# Patient Record
Sex: Female | Born: 1980 | Race: White | Hispanic: No | Marital: Single | Smoking: Current every day smoker
Health system: Southern US, Community
[De-identification: ages and names within clinical notes are randomized; demographics above are authoritative.]

---

## 2011-03-11 ENCOUNTER — Emergency Department (HOSPITAL_COMMUNITY)
Admission: EM | Admit: 2011-03-11 | Discharge: 2011-03-11 | Disposition: A | Discharge status: Home / Self Care | Payer: Self-pay | Attending: Emergency Medicine | Admitting: Emergency Medicine

## 2011-03-11 DIAGNOSIS — R51 Headache: Secondary | ICD-10-CM | POA: Insufficient documentation

## 2011-03-11 DIAGNOSIS — K0381 Cracked tooth: Secondary | ICD-10-CM | POA: Insufficient documentation

## 2011-03-11 DIAGNOSIS — R220 Localized swelling, mass and lump, head: Secondary | ICD-10-CM | POA: Insufficient documentation

## 2011-03-11 DIAGNOSIS — M542 Cervicalgia: Secondary | ICD-10-CM | POA: Insufficient documentation

## 2011-03-11 DIAGNOSIS — K089 Disorder of teeth and supporting structures, unspecified: Secondary | ICD-10-CM | POA: Insufficient documentation

## 2011-03-11 MED ORDER — CLINDAMYCIN HCL 150 MG PO CAPS: 300.0000 mg | ORAL_CAPSULE | Freq: Three times a day (TID) | ORAL | Status: AC

## 2011-03-11 MED ORDER — IBUPROFEN 800 MG PO TABS
800.0000 mg | ORAL_TABLET | Freq: Once | ORAL | Status: AC
  Administered 2011-03-11: 800 mg via ORAL
  Filled 2011-03-11: qty 1

## 2011-03-11 MED ORDER — OXYCODONE-ACETAMINOPHEN 5-325 MG PO TABS: 2.0000 | ORAL_TABLET | ORAL | Status: AC | PRN

## 2011-03-11 MED ORDER — BUPIVACAINE HCL (PF) 0.5 % IJ SOLN
10.0000 mL | Freq: Once | INTRAMUSCULAR | Status: AC
  Administered 2011-03-11: 3 mL
  Filled 2011-03-11: qty 10

## 2011-03-11 NOTE — ED Provider Notes (Signed)
History    30yF with dental pain. Gradual onset. Worsening since last night. Persistent. L sided facial pain. Cracked previously but has not followed up with dentist. No fever or chills. No trauma. No n/v. NO difficulty breathing or swallowing. No sore throat.   CSN: [id number] Arrival date & time: 03/11/2011 10:38 AM   First MD Initiated Contact with Patient 03/11/11 1044      Chief Complaint  Patient presents with  . Dental Pain    (Consider location/radiation/quality/duration/timing/severity/associated sxs/prior treatment) HPI  History reviewed. No pertinent past medical history.  History reviewed. No pertinent past surgical history.  Family History  Problem Relation Age of Onset  . Cancer Mother     History  Substance Use Topics  . Smoking status: Current Everyday Smoker -- 0.5 packs/day  . Smokeless tobacco: Never Used  . Alcohol Use: No    OB History    Grav Para Term Preterm Abortions TAB SAB Ect Mult Living                  Review of Systems   Review of symptoms negative unless otherwise noted in HPI.   Allergies  Penicillins  Home Medications   Current Outpatient Rx  Name Route Sig Dispense Refill  . IBUPROFEN 200 MG PO TABS Oral Take 400 mg by mouth every 3 (three) hours as needed. For tooth pain     . LEVONORGESTREL 20 MCG/24HR IU IUD Intrauterine 1 each by Intrauterine route once.      Marland Kitchen CLINDAMYCIN HCL 150 MG PO CAPS Oral Take 2 capsules (300 mg total) by mouth 3 (three) times daily. 42 capsule 0  . OXYCODONE-ACETAMINOPHEN 5-325 MG PO TABS Oral Take 2 tablets by mouth every 4 (four) hours as needed for pain. 10 tablet 0    BP 111/76  Pulse 110  Temp(Src) 98.1 F (36.7 C) (Oral)  Resp 16  Ht 5\' 4"  (1.626 m)  Wt 170 lb (77.111 kg)  BMI 29.18 kg/m2  SpO2 100%  Physical Exam  Nursing note and vitals reviewed. Constitutional: She appears well-developed and well-nourished. No distress.  HENT:  Head: Normocephalic and atraumatic.  Right  Ear: External ear normal.  Left Ear: External ear normal.  Mouth/Throat: Oropharynx is clear and moist.       Tooth #17 cracked at gum line. Mild surrounding gingival swelling. No discrete abscess. Posterior pharynx clear. Uvula midline. No tongue elevation. Submental tissues soft. Mild L lateral neck tenderness. No ertythema. No nodes. Neck supple. TMs clear b/l.  Eyes: Conjunctivae are normal. Right eye exhibits no discharge. Left eye exhibits no discharge.  Neck: Neck supple.  Cardiovascular: Normal rate, regular rhythm and normal heart sounds.  Exam reveals no gallop and no friction rub.   No murmur heard. Pulmonary/Chest: Effort normal and breath sounds normal. No respiratory distress.  Abdominal: Soft. She exhibits no distension. There is no tenderness.  Musculoskeletal: She exhibits no edema and no tenderness.  Neurological: She is alert.  Skin: Skin is warm and dry.  Psychiatric: She has a normal mood and affect. Her behavior is normal. Thought content normal.    ED Course  Dental Date/Time: 03/11/2011 11:18 AM Performed by: Raeford Razor Authorized by: Raeford Razor Consent: Verbal consent obtained. Written consent not obtained. Risks and benefits: risks, benefits and alternatives were discussed Consent given by: patient Patient identity confirmed: verbally with patient Local anesthesia used: yes Anesthesia: nerve block Local anesthetic: bupivacaine 0.5% without epinephrine Anesthetic total: 1.5 ml Patient sedated: no Patient tolerance:  Patient tolerated the procedure well with no immediate complications. Comments: L inferior alveolar block.   (including critical care time)  Labs Reviewed - No data to display No results found.   1. Cracked tooth       MDM  30yF with cracked molar. Mild gingival swelling but no discrete abscess. No evidence of deep space infection on exam. Pen allergic so clinda. Pain meds. Dental block in ED. Resources given for dental  clinics.        Raeford Razor, MD 03/11/11 [phone]

## 2011-03-11 NOTE — ED Notes (Signed)
States no insurance and looking for dentist.

## 2011-03-11 NOTE — ED Notes (Signed)
Lt. Lower molar, pain and swelling

## 2011-07-23 ENCOUNTER — Emergency Department (HOSPITAL_COMMUNITY)
Admission: EM | Admit: 2011-07-23 | Discharge: 2011-07-23 | Disposition: A | Discharge status: Home / Self Care | Payer: PRIVATE HEALTH INSURANCE | Attending: Emergency Medicine | Admitting: Emergency Medicine

## 2011-07-23 ENCOUNTER — Encounter (HOSPITAL_COMMUNITY): Payer: Self-pay | Admitting: *Deleted

## 2011-07-23 DIAGNOSIS — K089 Disorder of teeth and supporting structures, unspecified: Secondary | ICD-10-CM | POA: Insufficient documentation

## 2011-07-23 DIAGNOSIS — K047 Periapical abscess without sinus: Secondary | ICD-10-CM | POA: Insufficient documentation

## 2011-07-23 DIAGNOSIS — K029 Dental caries, unspecified: Secondary | ICD-10-CM | POA: Insufficient documentation

## 2011-07-23 MED ORDER — HYDROCODONE-ACETAMINOPHEN 5-325 MG PO TABS: 1.0000 | ORAL_TABLET | ORAL | Status: AC | PRN

## 2011-07-23 MED ORDER — CLINDAMYCIN HCL 150 MG PO CAPS: 450.0000 mg | ORAL_CAPSULE | Freq: Three times a day (TID) | ORAL | Status: AC

## 2011-07-23 MED ORDER — HYDROCODONE-ACETAMINOPHEN 5-325 MG PO TABS: 1.0000 | ORAL_TABLET | Freq: Once | ORAL | Status: DC

## 2011-07-23 NOTE — Discharge Instructions (Signed)
Dental Abscess A dental abscess usually starts from an infected tooth. Antibiotic medicine and pain pills can be helpful, but dental infections require the attention of a dentist. Rinse around the infected area often with salt water (a pinch of salt in 8 oz of warm water). Do not apply heat to the outside of your face. See your dentist or oral surgeon as soon as possible.  SEEK IMMEDIATE MEDICAL CARE IF:  You have increasing, severe pain that is not relieved by medicine.   You or your child has an oral temperature above 102 F (38.9 C), not controlled by medicine.   Your baby is older than 3 months with a rectal temperature of 102 F (38.9 C) or higher.   Your baby is 3 months old or younger with a rectal temperature of 100.4 F (38 C) or higher.   You develop chills, severe headache, difficulty breathing, or trouble swallowing.   You have swelling in the neck or around the eye.  Document Released: 04/08/2005 Document Revised: 03/28/2011 Document Reviewed: 09/17/2006 Exit[employer] Patient Information 2012 [employer]. 

## 2011-07-23 NOTE — ED Notes (Signed)
Pt reports left back dental pain x 2 days, states I think it is my wisdom tooth.

## 2011-07-23 NOTE — ED Provider Notes (Signed)
History     CSN: [id number]  Arrival date & time 07/23/11  1451   First MD Initiated Contact with Patient 07/23/11 1517      Chief Complaint  Patient presents with  . Dental Pain    (Consider location/radiation/quality/duration/timing/severity/associated sxs/prior treatment) HPI Comments: Patient comes in with 2-3 days of worsening left lower wisdom tooth pain.  She knows that is broken and decayed history of increasing pain is concerned about infection.  She denies any fevers.  She can swallow okay and has no shortness of breath.  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  Patient is a 31 y.o. female presenting with tooth pain. The history is provided by the patient. No language interpreter was used.  Dental PainThe primary symptoms include mouth pain. Primary symptoms do not include headaches, fever, shortness of breath, sore throat, angioedema or cough. The symptoms began 2 days ago. The symptoms are worsening. The symptoms are new. The symptoms occur constantly.    History reviewed. No pertinent past medical history.  History reviewed. No pertinent past surgical history.  Family History  Problem Relation Age of Onset  . Cancer Mother     History  Substance Use Topics  . Smoking status: Current Everyday Smoker -- 0.5 packs/day  . Smokeless tobacco: Never Used  . Alcohol Use: No    OB History    Grav Para Term Preterm Abortions TAB SAB Ect Mult Living                  Review of Systems  Constitutional: Negative.  Negative for fever and chills.  HENT: Positive for dental problem. Negative for sore throat.   Eyes: Negative.  Negative for discharge and redness.  Respiratory: Negative.  Negative for cough and shortness of breath.   Cardiovascular: Negative.  Negative for chest pain.  Gastrointestinal: Negative.  Negative for nausea, vomiting, abdominal pain and diarrhea.    Genitourinary: Negative.  Negative for dysuria and vaginal discharge.  Musculoskeletal: Negative.  Negative for back pain.  Skin: Negative.  Negative for color change and rash.  Neurological: Negative.  Negative for syncope and headaches.  Hematological: Negative.  Negative for adenopathy.  Psychiatric/Behavioral: Negative.  Negative for confusion.  All other systems reviewed and are negative.    Allergies  Penicillins  Home Medications   Current Outpatient Rx  Name Route Sig Dispense Refill  . IBUPROFEN 200 MG PO TABS Oral Take 400-800 mg by mouth every 3 (three) hours as needed. For tooth pain    . LEVONORGESTREL 20 MCG/24HR IU IUD Intrauterine 1 each by Intrauterine route once.      Marland Kitchen CLINDAMYCIN HCL 150 MG PO CAPS Oral Take 3 capsules (450 mg total) by mouth 3 (three) times daily. 90 capsule 0  . HYDROCODONE-ACETAMINOPHEN 5-325 MG PO TABS Oral Take 1 tablet by mouth every 4 (four) hours as needed for pain. 15 tablet 0    BP 123/81  Pulse 103  Temp(Src) 97.9 F (36.6 C) (Oral)  Resp 19  SpO2 100%  Physical Exam  Nursing note and vitals reviewed. Constitutional: She is oriented to person, place, and time. She appears well-developed and well-nourished.  Non-toxic appearance. She does not have a sickly appearance.  HENT:  Head: Normocephalic and atraumatic.  Mouth/Throat:    Eyes: Conjunctivae, EOM and lids are normal. Pupils are equal, round, and reactive to light. No scleral icterus.  Neck: Trachea normal and normal range of motion. Neck supple.  Cardiovascular: Normal rate.   Pulmonary/Chest: Effort normal.  Abdominal: Normal appearance. There is no CVA tenderness.  Musculoskeletal: Normal range of motion.  Neurological: She is alert and oriented to person, place, and time. She has normal strength.  Skin: Skin is warm, dry and intact. No rash noted.  Psychiatric: She has a normal mood and affect. Her behavior is normal. Judgment and thought content normal.    ED  Course  Procedures (including critical care time)  Labs Reviewed - No data to display No results found.   1. Dental abscess       MDM  Patient with dental abscess in her left wisdom tooth.  Patient is maintaining her airway is safe for discharge home.  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  She is allergic to penicillin so place her on clindamycin and I will give her Norco for pain control.        Nat Christen, MD 07/23/11 1530

## 2014-01-11 ENCOUNTER — Emergency Department (HOSPITAL_COMMUNITY): Payer: PRIVATE HEALTH INSURANCE

## 2014-01-11 ENCOUNTER — Encounter (HOSPITAL_COMMUNITY): Payer: Self-pay | Admitting: Emergency Medicine

## 2014-01-11 ENCOUNTER — Emergency Department (HOSPITAL_COMMUNITY)
Admission: EM | Admit: 2014-01-11 | Discharge: 2014-01-11 | Disposition: A | Discharge status: Home / Self Care | Payer: PRIVATE HEALTH INSURANCE | Attending: Emergency Medicine | Admitting: Emergency Medicine

## 2014-01-11 DIAGNOSIS — S99929A Unspecified injury of unspecified foot, initial encounter: Secondary | ICD-10-CM

## 2014-01-11 DIAGNOSIS — Y9389 Activity, other specified: Secondary | ICD-10-CM | POA: Insufficient documentation

## 2014-01-11 DIAGNOSIS — F17200 Nicotine dependence, unspecified, uncomplicated: Secondary | ICD-10-CM | POA: Insufficient documentation

## 2014-01-11 DIAGNOSIS — S90121A Contusion of right lesser toe(s) without damage to nail, initial encounter: Secondary | ICD-10-CM

## 2014-01-11 DIAGNOSIS — S8990XA Unspecified injury of unspecified lower leg, initial encounter: Secondary | ICD-10-CM | POA: Insufficient documentation

## 2014-01-11 DIAGNOSIS — Z880 Allergy status to penicillin: Secondary | ICD-10-CM | POA: Insufficient documentation

## 2014-01-11 DIAGNOSIS — Y9289 Other specified places as the place of occurrence of the external cause: Secondary | ICD-10-CM | POA: Insufficient documentation

## 2014-01-11 DIAGNOSIS — S90129A Contusion of unspecified lesser toe(s) without damage to nail, initial encounter: Secondary | ICD-10-CM | POA: Insufficient documentation

## 2014-01-11 DIAGNOSIS — S99919A Unspecified injury of unspecified ankle, initial encounter: Secondary | ICD-10-CM

## 2014-01-11 DIAGNOSIS — IMO0002 Reserved for concepts with insufficient information to code with codable children: Secondary | ICD-10-CM | POA: Insufficient documentation

## 2014-01-11 NOTE — ED Provider Notes (Signed)
CSN: [id number]     Arrival date & time 01/11/14  2440 History   First MD Initiated Contact with Patient 01/11/14 0827     Chief Complaint  Patient presents with  . Toe Pain     (Consider location/radiation/quality/duration/timing/severity/associated sxs/prior Treatment) HPI Comments: States that she was chasing after her dog and hit her right great toe on the coffee table. The toe initially went in opposite directions  Patient is a 33 y.o. female presenting with toe pain. The history is provided by the patient. No language interpreter was used.  Toe Pain This is a new problem. The current episode started today. The problem occurs constantly. The problem has been unchanged. The symptoms are aggravated by walking. She has tried nothing for the symptoms.    History reviewed. No pertinent past medical history. Past Surgical History  Procedure Laterality Date  . Cesarean section     Family History  Problem Relation Age of Onset  . Cancer Mother    History  Substance Use Topics  . Smoking status: Current Every Day Smoker -- 0.50 packs/day  . Smokeless tobacco: Never Used  . Alcohol Use: No   OB History   Grav Para Term Preterm Abortions TAB SAB Ect Mult Living                 Review of Systems  Constitutional: Negative.   Respiratory: Negative.   Cardiovascular: Negative.       Allergies  Penicillins  Home Medications   Prior to Admission medications   Medication Sig Start Date End Date Taking? Authorizing Provider  ibuprofen (ADVIL,MOTRIN) 200 MG tablet Take 400-800 mg by mouth every 3 (three) hours as needed. For tooth pain    Historical Provider, MD  levonorgestrel (MIRENA) 20 MCG/24HR IUD 1 each by Intrauterine route once.      Historical Provider, MD   BP 112/72  Pulse 99  Temp(Src) 98.5 F (36.9 C) (Oral)  Resp 18  Ht  (1.651 m)  Wt 160 lb (72.576 kg)  BMI 26.63 kg/m2  SpO2 100%  LMP 12/21/2013 Physical Exam  Nursing note and vitals  reviewed. Constitutional: She is oriented to person, place, and time. She appears well-developed and well-nourished.  Cardiovascular: Normal rate and regular rhythm.   Pulmonary/Chest: Effort normal and breath sounds normal.  Musculoskeletal:  Tender along the first pip joint of the right great toe. No gross deformity or swelling. Pulses intact  Neurological: She is alert and oriented to person, place, and time.  Skin: Skin is warm and dry.    ED Course  Procedures (including critical care time) Labs Review Labs Reviewed - No data to display  Imaging Review Dg Foot Complete Right  01/11/2014   CLINICAL DATA:  Pain post trauma  EXAM: RIGHT FOOT COMPLETE - 3+ VIEW  COMPARISON:  None.  FINDINGS: Frontal, oblique, and lateral views were obtained. There is no fracture or dislocation. Joint spaces appear intact. There is mild hallux valgus deformity at the first MTP joint. No erosive change.  IMPRESSION: Mild hallux valgus deformity at the first MTP joint. No fracture or dislocation. No appreciable arthropathic change.   Electronically Signed   By: Bretta Bang M.D.   On: 01/11/2014 08:58     EKG Interpretation None      MDM   Final diagnoses:  Toe contusion, right, initial encounter    No bony abnormality noted. Pt instructed on care at home.refusing any taping of toes    Teressa Lower, NP  01/11/14 0906 

## 2014-01-11 NOTE — ED Notes (Signed)
Pt reports hit r great toe on her coffee table this morning.

## 2014-01-11 NOTE — Discharge Instructions (Signed)
Contusion °A contusion is a deep bruise. Contusions are the result of an injury that caused bleeding under the skin. The contusion may turn blue, purple, or yellow. Minor injuries will give you a painless contusion, but more severe contusions may stay painful and swollen for a few weeks.  °CAUSES  °A contusion is usually caused by a blow, trauma, or direct force to an area of the body. °SYMPTOMS  °· Swelling and redness of the injured area. °· Bruising of the injured area. °· Tenderness and soreness of the injured area. °· Pain. °DIAGNOSIS  °The diagnosis can be made by taking a history and physical exam. An X-ray, CT scan, or MRI may be needed to determine if there were any associated injuries, such as fractures. °TREATMENT  °Specific treatment will depend on what area of the body was injured. In general, the best treatment for a contusion is resting, icing, elevating, and applying cold compresses to the injured area. Over-the-counter medicines may also be recommended for pain control. Ask your caregiver what the best treatment is for your contusion. °HOME CARE INSTRUCTIONS  °· Put ice on the injured area. °¨ Put ice in a plastic bag. °¨ Place a towel between your skin and the bag. °¨ Leave the ice on for 15-20 minutes, 3-4 times a day, or as directed by your health care provider. °· Only take over-the-counter or prescription medicines for pain, discomfort, or fever as directed by your caregiver. Your caregiver may recommend avoiding anti-inflammatory medicines (aspirin, ibuprofen, and naproxen) for 48 hours because these medicines may increase bruising. °· Rest the injured area. °· If possible, elevate the injured area to reduce swelling. °SEEK IMMEDIATE MEDICAL CARE IF:  °· You have increased bruising or swelling. °· You have pain that is getting worse. °· Your swelling or pain is not relieved with medicines. °MAKE SURE YOU:  °· Understand these instructions. °· Will watch your condition. °· Will get help right  away if you are not doing well or get worse. °Document Released: 01/16/2005 Document Revised: 04/13/2013 Document Reviewed: 02/11/2011 °Exit[employer]® Patient Information ©2015 [employer]. This information is not intended to replace advice given to you by your health care provider. Make sure you discuss any questions you have with your health care provider. ° °

## 2014-01-11 NOTE — ED Provider Notes (Signed)
Medical screening examination/treatment/procedure(s) were performed by non-physician practitioner and as supervising physician I was immediately available for consultation/collaboration.   EKG Interpretation None        Meridian Scherger L Arisbeth Purrington, MD 01/11/14 1250 

## 2014-11-23 ENCOUNTER — Emergency Department (HOSPITAL_COMMUNITY)
Admission: EM | Admit: 2014-11-23 | Discharge: 2014-11-23 | Disposition: A | Discharge status: Home / Self Care | Payer: Self-pay | Attending: Emergency Medicine | Admitting: Emergency Medicine

## 2014-11-23 ENCOUNTER — Encounter (HOSPITAL_COMMUNITY): Payer: Self-pay

## 2014-11-23 ENCOUNTER — Emergency Department (HOSPITAL_COMMUNITY): Payer: Self-pay

## 2014-11-23 ENCOUNTER — Emergency Department (HOSPITAL_COMMUNITY): Payer: PRIVATE HEALTH INSURANCE

## 2014-11-23 DIAGNOSIS — G43109 Migraine with aura, not intractable, without status migrainosus: Secondary | ICD-10-CM | POA: Insufficient documentation

## 2014-11-23 DIAGNOSIS — Z3202 Encounter for pregnancy test, result negative: Secondary | ICD-10-CM | POA: Insufficient documentation

## 2014-11-23 DIAGNOSIS — Z720 Tobacco use: Secondary | ICD-10-CM | POA: Insufficient documentation

## 2014-11-23 DIAGNOSIS — Z880 Allergy status to penicillin: Secondary | ICD-10-CM | POA: Insufficient documentation

## 2014-11-23 DIAGNOSIS — I639 Cerebral infarction, unspecified: Secondary | ICD-10-CM | POA: Insufficient documentation

## 2014-11-23 LAB — RAPID URINE DRUG SCREEN, HOSP PERFORMED
AMPHETAMINES: NOT DETECTED
Barbiturates: NOT DETECTED
Benzodiazepines: NOT DETECTED
Cocaine: NOT DETECTED
OPIATES: NOT DETECTED
TETRAHYDROCANNABINOL: POSITIVE — AB

## 2014-11-23 LAB — PROTIME-INR
INR: 1.15 (ref 0.00–1.49)
PROTHROMBIN TIME: 14.9 s (ref 11.6–15.2)

## 2014-11-23 LAB — URINALYSIS, ROUTINE W REFLEX MICROSCOPIC
BILIRUBIN URINE: NEGATIVE
Glucose, UA: NEGATIVE mg/dL
Hgb urine dipstick: NEGATIVE
Ketones, ur: NEGATIVE mg/dL
Leukocytes, UA: NEGATIVE
Nitrite: NEGATIVE
PH: 7 (ref 5.0–8.0)
PROTEIN: NEGATIVE mg/dL
Specific Gravity, Urine: 1.02 (ref 1.005–1.030)
UROBILINOGEN UA: 0.2 mg/dL (ref 0.0–1.0)

## 2014-11-23 LAB — CBC
HCT: 41.8 % (ref 36.0–46.0)
HEMOGLOBIN: 14 g/dL (ref 12.0–15.0)
MCH: 32.8 pg (ref 26.0–34.0)
MCHC: 33.5 g/dL (ref 30.0–36.0)
MCV: 97.9 fL (ref 78.0–100.0)
PLATELETS: 143 10*3/uL — AB (ref 150–400)
RBC: 4.27 MIL/uL (ref 3.87–5.11)
RDW: 12.7 % (ref 11.5–15.5)
WBC: 7 10*3/uL (ref 4.0–10.5)

## 2014-11-23 LAB — COMPREHENSIVE METABOLIC PANEL
ALBUMIN: 4.8 g/dL (ref 3.5–5.0)
ALT: 18 U/L (ref 14–54)
AST: 18 U/L (ref 15–41)
Alkaline Phosphatase: 51 U/L (ref 38–126)
Anion gap: 9 (ref 5–15)
BILIRUBIN TOTAL: 0.7 mg/dL (ref 0.3–1.2)
BUN: 14 mg/dL (ref 6–20)
CHLORIDE: 106 mmol/L (ref 101–111)
CO2: 25 mmol/L (ref 22–32)
CREATININE: 0.74 mg/dL (ref 0.44–1.00)
Calcium: 9.3 mg/dL (ref 8.9–10.3)
GFR calc Af Amer: >60 mL/min (ref >60)
GFR calc non Af Amer: >60 mL/min (ref >60)
Glucose, Bld: 115 mg/dL — ABNORMAL HIGH (ref 65–99)
POTASSIUM: 3.9 mmol/L (ref 3.5–5.1)
SODIUM: 140 mmol/L (ref 135–145)
TOTAL PROTEIN: 7.8 g/dL (ref 6.5–8.1)

## 2014-11-23 LAB — DIFFERENTIAL
BASOS ABS: 0 10*3/uL (ref 0.0–0.1)
Basophils Relative: 1 % (ref 0–1)
EOS ABS: 0.2 10*3/uL (ref 0.0–0.7)
EOS PCT: 2 % (ref 0–5)
LYMPHS ABS: 1.9 10*3/uL (ref 0.7–4.0)
LYMPHS PCT: 27 % (ref 12–46)
Monocytes Absolute: 0.4 10*3/uL (ref 0.1–1.0)
Monocytes Relative: 6 % (ref 3–12)
Neutro Abs: 4.5 10*3/uL (ref 1.7–7.7)
Neutrophils Relative %: 64 % (ref 43–77)

## 2014-11-23 LAB — ETHANOL: Alcohol, Ethyl (B): <5 mg/dL (ref <5)

## 2014-11-23 LAB — POC URINE PREG, ED: Preg Test, Ur: NEGATIVE

## 2014-11-23 LAB — APTT: APTT: 29 s (ref 24–37)

## 2014-11-23 LAB — TROPONIN I: Troponin I: <0.03 ng/mL (ref <0.031)

## 2014-11-23 MED ORDER — SODIUM CHLORIDE 0.9 % IV SOLN
INTRAVENOUS | Status: DC
  Administered 2014-11-23: 11:00:00 via INTRAVENOUS

## 2014-11-23 MED ORDER — HYDROMORPHONE HCL 2 MG/ML IJ SOLN
2.0000 mg | Freq: Once | INTRAMUSCULAR | Status: AC
  Administered 2014-11-23: 2 mg via INTRAMUSCULAR
  Filled 2014-11-23: qty 1

## 2014-11-23 MED ORDER — GADOBENATE DIMEGLUMINE 529 MG/ML IV SOLN
15.0000 mL | Freq: Once | INTRAVENOUS | Status: AC | PRN
  Administered 2014-11-23: 15 mL via INTRAVENOUS

## 2014-11-23 MED ORDER — HYDROMORPHONE HCL 1 MG/ML IJ SOLN
1.0000 mg | Freq: Once | INTRAMUSCULAR | Status: AC
  Administered 2014-11-23: 1 mg via INTRAVENOUS
  Filled 2014-11-23: qty 1

## 2014-11-23 MED ORDER — ONDANSETRON HCL 4 MG/2ML IJ SOLN
4.0000 mg | Freq: Once | INTRAMUSCULAR | Status: AC
  Administered 2014-11-23: 4 mg via INTRAVENOUS
  Filled 2014-11-23: qty 2

## 2014-11-23 MED ORDER — PROMETHAZINE HCL 25 MG/ML IJ SOLN
12.5000 mg | Freq: Once | INTRAMUSCULAR | Status: AC
  Administered 2014-11-23: 12.5 mg via INTRAVENOUS
  Filled 2014-11-23: qty 1

## 2014-11-23 MED ORDER — DEXAMETHASONE SODIUM PHOSPHATE 4 MG/ML IJ SOLN
10.0000 mg | Freq: Once | INTRAMUSCULAR | Status: AC
  Administered 2014-11-23: 10 mg via INTRAVENOUS
  Filled 2014-11-23: qty 3

## 2014-11-23 MED ORDER — DIPHENHYDRAMINE HCL 50 MG/ML IJ SOLN
12.5000 mg | Freq: Once | INTRAMUSCULAR | Status: AC
  Administered 2014-11-23: 12.5 mg via INTRAVENOUS
  Filled 2014-11-23: qty 1

## 2014-11-23 MED ORDER — SODIUM CHLORIDE 0.9 % IV BOLUS (SEPSIS)
1000.0000 mL | Freq: Once | INTRAVENOUS | Status: AC
  Administered 2014-11-23: 1000 mL via INTRAVENOUS

## 2014-11-23 NOTE — ED Notes (Signed)
Dr. Deretha Emory notified of pt's vision impairment of only being able to see "white" from right eye.

## 2014-11-23 NOTE — ED Notes (Signed)
Pt remains in MRI 

## 2014-11-23 NOTE — Discharge Instructions (Signed)
MRIs without any significant findings. This is most likely a complicated migraine. Go home and rest typically for the next 24 hours. Some of the migraine medicine given here continue to work for about 24 hours. Return for any new or worse symptoms. Work note provided.

## 2014-11-23 NOTE — ED Notes (Signed)
Pt reports at 0845 she was at work and started feeling dizzy, shortly after that started having pain in back of head and seeing white spots in r eye.  Denies history of migraines.  Reports nausea, no vomiting.

## 2014-11-23 NOTE — ED Notes (Signed)
Pt made aware to return if symptoms worsen or if any life threatening symptoms occur.   

## 2014-11-23 NOTE — ED Notes (Signed)
neurotele at bedside and in progress.

## 2014-11-23 NOTE — Progress Notes (Signed)
CODE STROKE  Beeper 10:55 Devona Konig at [facility] [phone]

## 2014-11-23 NOTE — ED Notes (Signed)
Dr. Deretha Emory cancelled code stroke 1138

## 2014-11-23 NOTE — ED Notes (Signed)
Called SOC at 11:02

## 2014-11-23 NOTE — ED Provider Notes (Signed)
CSN: [id number]     Arrival date & time 11/23/14  1015 History  This chart was scribed for Rhonda Mulders, MD by Elon Spanner, ED Scribe. This patient was seen in room APA02/APA02 and the patient's care was started at 10:44 AM.   Chief Complaint  Patient presents with  . Code Stroke   The history is provided by the patient. No language interpreter was used.   HPI Comments: Rhonda Harvey is a 34 y.o. female with no personal or family history of migraine who presents to the Emergency Department complaining of a 10/10 right, back of the Harvey headache onset 8:45 am today.  The patient reports associated dizziness, nausea and blurred vision onset concurrently with the headache.  Currently, the patient only sees a white out of her right eye.  She denies fever, vomiting.  LNMP: 5 years ago (IUD in place).   History reviewed. No pertinent past medical history.   Past Surgical History  Procedure Laterality Date  . Cesarean section     Family History  Problem Relation Age of Onset  . Cancer Mother    History  Substance Use Topics  . Smoking status: Current Every Day Smoker -- 0.50 packs/day  . Smokeless tobacco: Never Used  . Alcohol Use: No   OB History    No data available     Review of Systems  Constitutional: Negative for fever and chills.  HENT: Negative for rhinorrhea and sore throat.   Eyes: Positive for visual disturbance.  Respiratory: Negative for cough and shortness of breath.   Cardiovascular: Negative for chest pain and leg swelling.  Gastrointestinal: Positive for nausea. Negative for vomiting, abdominal pain and diarrhea.  Genitourinary: Negative for dysuria and hematuria.  Musculoskeletal: Positive for neck pain. Negative for back pain.  Skin: Negative for rash.  Neurological: Positive for dizziness and headaches.  Hematological: Does not bruise/bleed easily.  Psychiatric/Behavioral: Negative for confusion.      Allergies  Penicillins  Home Medications    Prior to Admission medications   Medication Sig Start Date End Date Taking? Authorizing Provider  ibuprofen (ADVIL,MOTRIN) 200 MG tablet Take 400 mg by mouth every 6 (six) hours as needed for moderate pain.   Yes Historical Provider, MD  levonorgestrel (MIRENA) 20 MCG/24HR IUD 1 each by Intrauterine route once.   Yes Historical Provider, MD   BP 101/63 mmHg  Pulse 65  Temp(Src) 97.4 F (36.3 C) (Oral)  Resp 13  Ht 5\' 4"  (1.626 m)  Wt 162 lb (73.483 kg)  BMI 27.79 kg/m2  SpO2 100%  LMP  Physical Exam  Constitutional: She is oriented to person, place, and time. She appears well-developed and well-nourished. No distress.  HENT:  Harvey: Normocephalic and atraumatic.  Pupils normal.  Eyes track normal.  Sclera clear.    Eyes: Conjunctivae and EOM are normal.  Neck: Neck supple. No tracheal deviation present.  Cardiovascular: Normal rate and regular rhythm.   Pulmonary/Chest: Effort normal. No respiratory distress.  Lungs CTA bilaterally.   Abdominal: Bowel sounds are normal. There is no tenderness.  Musculoskeletal: Normal range of motion.  Neurological: She is alert and oriented to person, place, and time. No cranial nerve deficit. She exhibits normal muscle tone. Coordination normal.  Skin: Skin is warm and dry.  Psychiatric: She has a normal mood and affect. Her behavior is normal.  Nursing note and vitals reviewed.   ED Course  Procedures (including critical care time)  DIAGNOSTIC STUDIES: Oxygen Saturation is 100% on RA, normal  by my interpretation.    COORDINATION OF CARE:  10:46 AM Discussed treatment plan with patient at bedside.  Patient acknowledges and agrees with plan.    Labs Review Labs Reviewed  CBC - Abnormal; Notable for the following:    Platelets 143 (*)    All other components within normal limits  COMPREHENSIVE METABOLIC PANEL - Abnormal; Notable for the following:    Glucose, Bld 115 (*)    All other components within normal limits  URINE RAPID  DRUG SCREEN, HOSP PERFORMED - Abnormal; Notable for the following:    Tetrahydrocannabinol POSITIVE (*)    All other components within normal limits  URINALYSIS, ROUTINE W REFLEX MICROSCOPIC (NOT AT [facility]) - Abnormal; Notable for the following:    APPearance HAZY (*)    All other components within normal limits  ETHANOL  PROTIME-INR  APTT  DIFFERENTIAL  TROPONIN I  POC URINE PREG, ED   Results for orders placed or performed during the hospital encounter of 11/23/14  Ethanol  Result Value Ref Range   Alcohol, Ethyl (B) <5 <5 mg/dL  Protime-INR  Result Value Ref Range   Prothrombin Time 14.9 11.6 - 15.2 seconds   INR 1.15 0.00 - 1.49  APTT  Result Value Ref Range   aPTT 29 24 - 37 seconds  CBC  Result Value Ref Range   WBC 7.0 4.0 - 10.5 K/uL   RBC 4.27 3.87 - 5.11 MIL/uL   Hemoglobin 14.0 12.0 - 15.0 g/dL   HCT 16.1 09.6 - 04.5 %   MCV 97.9 78.0 - 100.0 fL   MCH 32.8 26.0 - 34.0 pg   MCHC 33.5 30.0 - 36.0 g/dL   RDW 40.9 81.1 - 91.4 %   Platelets 143 (L) 150 - 400 K/uL  Differential  Result Value Ref Range   Neutrophils Relative % 64 43 - 77 %   Neutro Abs 4.5 1.7 - 7.7 K/uL   Lymphocytes Relative 27 12 - 46 %   Lymphs Abs 1.9 0.7 - 4.0 K/uL   Monocytes Relative 6 3 - 12 %   Monocytes Absolute 0.4 0.1 - 1.0 K/uL   Eosinophils Relative 2 0 - 5 %   Eosinophils Absolute 0.2 0.0 - 0.7 K/uL   Basophils Relative 1 0 - 1 %   Basophils Absolute 0.0 0.0 - 0.1 K/uL  Comprehensive metabolic panel  Result Value Ref Range   Sodium 140 135 - 145 mmol/L   Potassium 3.9 3.5 - 5.1 mmol/L   Chloride 106 101 - 111 mmol/L   CO2 25 22 - 32 mmol/L   Glucose, Bld 115 (H) 65 - 99 mg/dL   BUN 14 6 - 20 mg/dL   Creatinine, Ser 7.82 0.44 - 1.00 mg/dL   Calcium 9.3 8.9 - 95.6 mg/dL   Total Protein 7.8 6.5 - 8.1 g/dL   Albumin 4.8 3.5 - 5.0 g/dL   AST 18 15 - 41 U/L   ALT 18 14 - 54 U/L   Alkaline Phosphatase 51 38 - 126 U/L   Total Bilirubin 0.7 0.3 - 1.2 mg/dL   GFR calc non Af Amer  >60 >60 mL/min   GFR calc Af Amer >60 >60 mL/min   Anion gap 9 5 - 15  Urine rapid drug screen (hosp performed)not at [facility]  Result Value Ref Range   Opiates NONE DETECTED NONE DETECTED   Cocaine NONE DETECTED NONE DETECTED   Benzodiazepines NONE DETECTED NONE DETECTED   Amphetamines NONE DETECTED NONE DETECTED  Tetrahydrocannabinol POSITIVE (A) NONE DETECTED   Barbiturates NONE DETECTED NONE DETECTED  Urinalysis, Routine w reflex microscopic (not at [facility])  Result Value Ref Range   Color, Urine YELLOW YELLOW   APPearance HAZY (A) CLEAR   Specific Gravity, Urine 1.020 1.005 - 1.030   pH 7.0 5.0 - 8.0   Glucose, UA NEGATIVE NEGATIVE mg/dL   Hgb urine dipstick NEGATIVE NEGATIVE   Bilirubin Urine NEGATIVE NEGATIVE   Ketones, ur NEGATIVE NEGATIVE mg/dL   Protein, ur NEGATIVE NEGATIVE mg/dL   Urobilinogen, UA 0.2 0.0 - 1.0 mg/dL   Nitrite NEGATIVE NEGATIVE   Leukocytes, UA NEGATIVE NEGATIVE  Troponin I  Result Value Ref Range   Troponin I <0.03 <0.031 ng/mL  POC Urine Pregnancy, ED  (not at [facility])  Result Value Ref Range   Preg Test, Ur NEGATIVE NEGATIVE     Imaging Review Ct Harvey Wo Contrast  11/23/2014   CLINICAL DATA:  Acute onset posterior headache with blurred vision right eye  EXAM: CT Harvey WITHOUT CONTRAST  TECHNIQUE: Contiguous axial images were obtained from the base of the skull through the vertex without intravenous contrast.  COMPARISON:  None.  FINDINGS: The ventricles are normal in size and configuration. There is no intracranial mass, hemorrhage, extra-axial fluid collection, or midline shift. The gray-white compartments are normal. No acute infarct evident. The middle cerebral arteries do not show increased attenuation on either side. Bony calvarium appears intact. The mastoid air cells are clear. There is patchy ethmoid sinus disease bilaterally. Visualized orbits appear symmetric and normal bilaterally.  IMPRESSION: Patchy ethmoid sinus disease bilaterally. Study  otherwise unremarkable. In particular, no evidence of mass, hemorrhage, or acute appearing infarct.  Critical Value/emergent results were called by telephone at the time of interpretation on 11/23/2014 at 11:08 am to Dr. Vanetta Harvey , who verbally acknowledged these results.   Electronically Signed   By: Bretta Bang III M.D.   On: 11/23/2014 11:10   Rhonda Harvey Wo Contrast  11/23/2014   CLINICAL DATA:  Headache and loss of vision in the RIGHT eye for 1 day. No known injury.  EXAM: Rhonda Harvey WITHOUT CONTRAST  Rhonda CIRCLE OF WILLIS WITHOUT CONTRAST  MRV INTRACRANIAL WITHOUT  CONTRAST  MRI OF THE ORBITS WITHOUT AND WITH CONTRAST  TECHNIQUE: Multiplanar, multiecho pulse sequences of the brain and surrounding structures were obtained according to standard protocol without and with intravenous contrast; Multiplanar and multiecho pulse sequences of the orbits and surrounding structures were obtained including fat saturation techniques, before and after intravenous contrast administration.; Angiographic images of the Circle of Willis were obtained using MRA technique without intravenous contrast.; Angiographic images of the intracranial venous structures were obtained using MRV technique without intravenous contrast. MRI of the orbits were obtained without and with contrast using fat saturation techniques.  CONTRAST:  15mL MULTIHANCE GADOBENATE DIMEGLUMINE 529 MG/ML IV SOLN  COMPARISON:  None.  CT Harvey earlier today.  FINDINGS: Rhonda Harvey FINDINGS  No evidence for acute infarction, hemorrhage, mass lesion, hydrocephalus, or extra-axial fluid. There is no atrophy or white matter disease. Flow voids are maintained throughout the carotid, basilar, and vertebral arteries. There are no areas of chronic hemorrhage. Pituitary, pineal, and cerebellar tonsils unremarkable. No upper cervical lesions. Post infusion, no abnormal enhancement of the brain or meninges. Visualized calvarium, skull base, and upper cervical osseous  structures unremarkable. Scalp and extracranial soft tissues, orbits, sinuses, and mastoids show no acute process.  Rhonda CIRCLE OF WILLIS FINDINGS  The internal carotid arteries are  widely patent. The basilar artery is widely patent with vertebrals codominant. There is no intracranial stenosis or aneurysm. Both ophthalmic arteries appear patent.  MRV INTRACRANIAL FINDINGS  The superior sagittal sinus is widely patent. Both transverse sinuses and sigmoid sinuses are widely patent. Both internal jugular veins are unremarkable.  Internal cerebral veins are widely patent. Vein of Galen and straight sinus are widely patent. No cavernous sinus abnormality.  MRI ORBITS FINDINGS  The globes are equal in size and appear normal. Gaze is mildly dysconjugate. Normal-appearing optic nerves. No perioptic nerve sheath dilatation. No evidence for papilledema. No medial or inferior blowout injury. No evidence for pseudotumor or lacrimal gland enlargement. Retro-orbital/intraconal fat is normal. Mild chronic sinus disease without orbital inflammation. Normal sized orbital musculature. No intraorbital masses or evidence for vascular anomaly.  IMPRESSION: Negative MRI of the brain. No acute or focal intracranial abnormality.  Negative MRI of the orbits. No cause seen for RIGHT eye visual loss.  Normal MRA intracranial. No evidence for aneurysm or ophthalmic artery occlusion.  Normal MRV intracranial.  No venous sinus thrombosis is evident.   Electronically Signed   By: Elsie Stain M.D.   On: 11/23/2014 14:29   Rhonda Harvey Contrast  11/23/2014   CLINICAL DATA:  Headache and loss of vision in the RIGHT eye for 1 day. No known injury.  EXAM: Rhonda Harvey WITHOUT CONTRAST  Rhonda CIRCLE OF WILLIS WITHOUT CONTRAST  MRV INTRACRANIAL WITHOUT  CONTRAST  MRI OF THE ORBITS WITHOUT AND WITH CONTRAST  TECHNIQUE: Multiplanar, multiecho pulse sequences of the brain and surrounding structures were obtained according to standard protocol without and with  intravenous contrast; Multiplanar and multiecho pulse sequences of the orbits and surrounding structures were obtained including fat saturation techniques, before and after intravenous contrast administration.; Angiographic images of the Circle of Willis were obtained using MRA technique without intravenous contrast.; Angiographic images of the intracranial venous structures were obtained using MRV technique without intravenous contrast. MRI of the orbits were obtained without and with contrast using fat saturation techniques.  CONTRAST:  15mL MULTIHANCE GADOBENATE DIMEGLUMINE 529 MG/ML IV SOLN  COMPARISON:  None.  CT Harvey earlier today.  FINDINGS: Rhonda Harvey FINDINGS  No evidence for acute infarction, hemorrhage, mass lesion, hydrocephalus, or extra-axial fluid. There is no atrophy or white matter disease. Flow voids are maintained throughout the carotid, basilar, and vertebral arteries. There are no areas of chronic hemorrhage. Pituitary, pineal, and cerebellar tonsils unremarkable. No upper cervical lesions. Post infusion, no abnormal enhancement of the brain or meninges. Visualized calvarium, skull base, and upper cervical osseous structures unremarkable. Scalp and extracranial soft tissues, orbits, sinuses, and mastoids show no acute process.  Rhonda CIRCLE OF WILLIS FINDINGS  The internal carotid arteries are widely patent. The basilar artery is widely patent with vertebrals codominant. There is no intracranial stenosis or aneurysm. Both ophthalmic arteries appear patent.  MRV INTRACRANIAL FINDINGS  The superior sagittal sinus is widely patent. Both transverse sinuses and sigmoid sinuses are widely patent. Both internal jugular veins are unremarkable.  Internal cerebral veins are widely patent. Vein of Galen and straight sinus are widely patent. No cavernous sinus abnormality.  MRI ORBITS FINDINGS  The globes are equal in size and appear normal. Gaze is mildly dysconjugate. Normal-appearing optic nerves. No perioptic  nerve sheath dilatation. No evidence for papilledema. No medial or inferior blowout injury. No evidence for pseudotumor or lacrimal gland enlargement. Retro-orbital/intraconal fat is normal. Mild chronic sinus disease without orbital inflammation. Normal sized orbital musculature.  No intraorbital masses or evidence for vascular anomaly.  IMPRESSION: Negative MRI of the brain. No acute or focal intracranial abnormality.  Negative MRI of the orbits. No cause seen for RIGHT eye visual loss.  Normal MRA intracranial. No evidence for aneurysm or ophthalmic artery occlusion.  Normal MRV intracranial.  No venous sinus thrombosis is evident.   Electronically Signed   By: Elsie Stain M.D.   On: 11/23/2014 14:29   Rhonda Mrv Harvey Wo Cm  11/23/2014   CLINICAL DATA:  Headache and loss of vision in the RIGHT eye for 1 day. No known injury.  EXAM: Rhonda Harvey WITHOUT CONTRAST  Rhonda CIRCLE OF WILLIS WITHOUT CONTRAST  MRV INTRACRANIAL WITHOUT  CONTRAST  MRI OF THE ORBITS WITHOUT AND WITH CONTRAST  TECHNIQUE: Multiplanar, multiecho pulse sequences of the brain and surrounding structures were obtained according to standard protocol without and with intravenous contrast; Multiplanar and multiecho pulse sequences of the orbits and surrounding structures were obtained including fat saturation techniques, before and after intravenous contrast administration.; Angiographic images of the Circle of Willis were obtained using MRA technique without intravenous contrast.; Angiographic images of the intracranial venous structures were obtained using MRV technique without intravenous contrast. MRI of the orbits were obtained without and with contrast using fat saturation techniques.  CONTRAST:  15mL MULTIHANCE GADOBENATE DIMEGLUMINE 529 MG/ML IV SOLN  COMPARISON:  None.  CT Harvey earlier today.  FINDINGS: Rhonda Harvey FINDINGS  No evidence for acute infarction, hemorrhage, mass lesion, hydrocephalus, or extra-axial fluid. There is no atrophy or white matter  disease. Flow voids are maintained throughout the carotid, basilar, and vertebral arteries. There are no areas of chronic hemorrhage. Pituitary, pineal, and cerebellar tonsils unremarkable. No upper cervical lesions. Post infusion, no abnormal enhancement of the brain or meninges. Visualized calvarium, skull base, and upper cervical osseous structures unremarkable. Scalp and extracranial soft tissues, orbits, sinuses, and mastoids show no acute process.  Rhonda CIRCLE OF WILLIS FINDINGS  The internal carotid arteries are widely patent. The basilar artery is widely patent with vertebrals codominant. There is no intracranial stenosis or aneurysm. Both ophthalmic arteries appear patent.  MRV INTRACRANIAL FINDINGS  The superior sagittal sinus is widely patent. Both transverse sinuses and sigmoid sinuses are widely patent. Both internal jugular veins are unremarkable.  Internal cerebral veins are widely patent. Vein of Galen and straight sinus are widely patent. No cavernous sinus abnormality.  MRI ORBITS FINDINGS  The globes are equal in size and appear normal. Gaze is mildly dysconjugate. Normal-appearing optic nerves. No perioptic nerve sheath dilatation. No evidence for papilledema. No medial or inferior blowout injury. No evidence for pseudotumor or lacrimal gland enlargement. Retro-orbital/intraconal fat is normal. Mild chronic sinus disease without orbital inflammation. Normal sized orbital musculature. No intraorbital masses or evidence for vascular anomaly.  IMPRESSION: Negative MRI of the brain. No acute or focal intracranial abnormality.  Negative MRI of the orbits. No cause seen for RIGHT eye visual loss.  Normal MRA intracranial. No evidence for aneurysm or ophthalmic artery occlusion.  Normal MRV intracranial.  No venous sinus thrombosis is evident.   Electronically Signed   By: Elsie Stain M.D.   On: 11/23/2014 14:29   Rhonda Harvey Wo/w Cm  11/23/2014   CLINICAL DATA:  Headache and loss of vision in the RIGHT  eye for 1 day. No known injury.  EXAM: Rhonda Harvey WITHOUT CONTRAST  Rhonda CIRCLE OF WILLIS WITHOUT CONTRAST  MRV INTRACRANIAL WITHOUT  CONTRAST  MRI OF THE ORBITS WITHOUT AND WITH  CONTRAST  TECHNIQUE: Multiplanar, multiecho pulse sequences of the brain and surrounding structures were obtained according to standard protocol without and with intravenous contrast; Multiplanar and multiecho pulse sequences of the orbits and surrounding structures were obtained including fat saturation techniques, before and after intravenous contrast administration.; Angiographic images of the Circle of Willis were obtained using MRA technique without intravenous contrast.; Angiographic images of the intracranial venous structures were obtained using MRV technique without intravenous contrast. MRI of the orbits were obtained without and with contrast using fat saturation techniques.  CONTRAST:  15mL MULTIHANCE GADOBENATE DIMEGLUMINE 529 MG/ML IV SOLN  COMPARISON:  None.  CT Harvey earlier today.  FINDINGS: Rhonda Harvey FINDINGS  No evidence for acute infarction, hemorrhage, mass lesion, hydrocephalus, or extra-axial fluid. There is no atrophy or white matter disease. Flow voids are maintained throughout the carotid, basilar, and vertebral arteries. There are no areas of chronic hemorrhage. Pituitary, pineal, and cerebellar tonsils unremarkable. No upper cervical lesions. Post infusion, no abnormal enhancement of the brain or meninges. Visualized calvarium, skull base, and upper cervical osseous structures unremarkable. Scalp and extracranial soft tissues, orbits, sinuses, and mastoids show no acute process.  Rhonda CIRCLE OF WILLIS FINDINGS  The internal carotid arteries are widely patent. The basilar artery is widely patent with vertebrals codominant. There is no intracranial stenosis or aneurysm. Both ophthalmic arteries appear patent.  MRV INTRACRANIAL FINDINGS  The superior sagittal sinus is widely patent. Both transverse sinuses and sigmoid sinuses  are widely patent. Both internal jugular veins are unremarkable.  Internal cerebral veins are widely patent. Vein of Galen and straight sinus are widely patent. No cavernous sinus abnormality.  MRI ORBITS FINDINGS  The globes are equal in size and appear normal. Gaze is mildly dysconjugate. Normal-appearing optic nerves. No perioptic nerve sheath dilatation. No evidence for papilledema. No medial or inferior blowout injury. No evidence for pseudotumor or lacrimal gland enlargement. Retro-orbital/intraconal fat is normal. Mild chronic sinus disease without orbital inflammation. Normal sized orbital musculature. No intraorbital masses or evidence for vascular anomaly.  IMPRESSION: Negative MRI of the brain. No acute or focal intracranial abnormality.  Negative MRI of the orbits. No cause seen for RIGHT eye visual loss.  Normal MRA intracranial. No evidence for aneurysm or ophthalmic artery occlusion.  Normal MRV intracranial.  No venous sinus thrombosis is evident.   Electronically Signed   By: Elsie Stain M.D.   On: 11/23/2014 14:29     EKG Interpretation   Date/Time:  Wednesday November 23 2014 11:07:19 EDT Ventricular Rate:  58 PR Interval:  136 QRS Duration: 104 QT Interval:  423 QTC Calculation: 415 R Axis:   92 Text Interpretation:  Sinus rhythm Borderline right axis deviation  Confirmed by Kleigh Hoelzer  MD, Edmond Ginsberg ([id number]) on 11/23/2014 11:12:20 AM      CRITICAL CARE Performed by: Rhonda Harvey Total critical care time: 30 Critical care time was exclusive of separately billable procedures and treating other patients. Critical care was necessary to treat or prevent imminent or life-threatening deterioration. Critical care was time spent personally by me on the following activities: development of treatment plan with patient and/or surrogate as well as nursing, discussions with consultants, evaluation of patient's response to treatment, examination of patient, obtaining history from patient or  surrogate, ordering and performing treatments and interventions, ordering and review of laboratory studies, ordering and review of radiographic studies, pulse oximetry and re-evaluation of patient's condition.    MDM   Final diagnoses:  Stroke  Complicated migraine     Patient  clinically presented with symptoms concerning for a complicated migraine. But since here presented early on the onset and had this significant visual changes a code stroke was called to facilitate Harvey CT and workup. Consultation with the neurologist also felt that it was probably a complicated migraine but since patient is in her 30s never had migraines before never any visual changes like this before recommended extensive MRI/MRA workup. That was all negative patient was treated as as if a migraine visual changes all resolved headache improved significantly then started to come back some. Patient was given Benadryl and Phenergan and Decadron as well as hydromorphone. Patient we discharged home and follow-up with neurology.     I personally performed the services described in this documentation, which was scribed in my presence. The recorded information has been reviewed and is accurate.     Rhonda Mulders, MD 11/23/14 1536

## 2016-07-03 IMAGING — MR MR HEAD WO/W CM
6 of 12 series · 25 of 48 positions shown · IV contrast (ml Multihance)
Comparison: None.

CT head earlier today.

CLINICAL DATA: Headache and loss of vision in the RIGHT eye for 1
day. No known injury.

EXAM:
MR HEAD WITHOUT CONTRAST
MR CIRCLE OF WILLIS WITHOUT CONTRAST
MRV INTRACRANIAL WITHOUT  CONTRAST
MRI OF THE ORBITS WITHOUT AND WITH CONTRAST
TECHNIQUE: Multiplanar, multiecho pulse sequences of the brain and surrounding
structures were obtained according to standard protocol without and
with intravenous contrast; Multiplanar and multiecho pulse sequences
of the orbits and surrounding structures were obtained including fat
saturation techniques, before and after intravenous contrast
administration.; Angiographic images of the Circle of Willis were
obtained using MRA technique without intravenous contrast.;
Angiographic images of the intracranial venous structures were
obtained using MRV technique without intravenous contrast. MRI of
the orbits were obtained without and with contrast using fat
saturation techniques.
CONTRAST:  15mL MULTIHANCE GADOBENATE DIMEGLUMINE 529 MG/ML IV SOLN

[Series 4: T2 · axial · 5.0mm · 0.75mm/px · z∈[-118,+25]mm · 2 of 23 slices shown (1 of 2)]
[im 1/23]
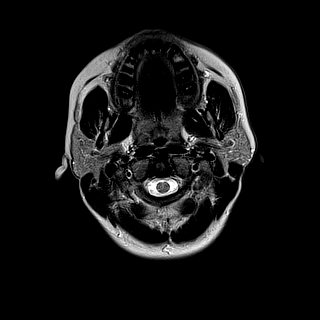
[im 23/23]
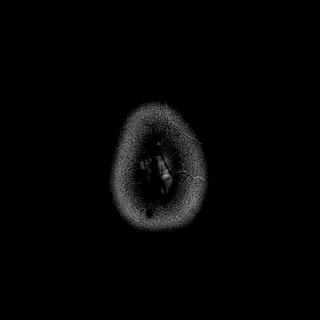

[Series 5: FLAIR · axial · 5.0mm · 0.94mm/px · z∈[-118,+25]mm · 2 of 23 slices shown]
[im 1/23]
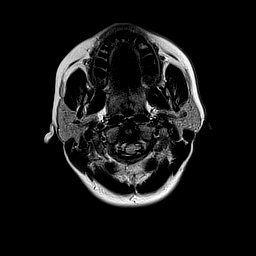
[im 23/23]
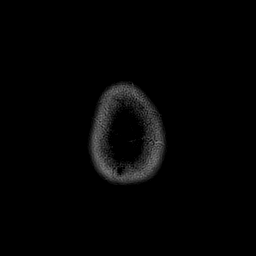

[Series 6: T1 · axial · 2.0mm · 0.45mm/px · z∈[-126,+57]mm · 8 of 93 slices shown]
[im 1/93]
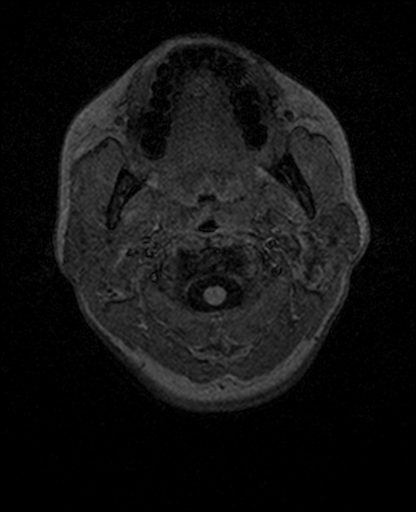
[im 12/93]
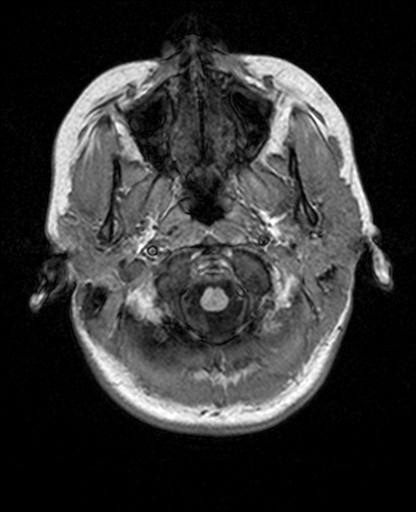
[im 24/93]
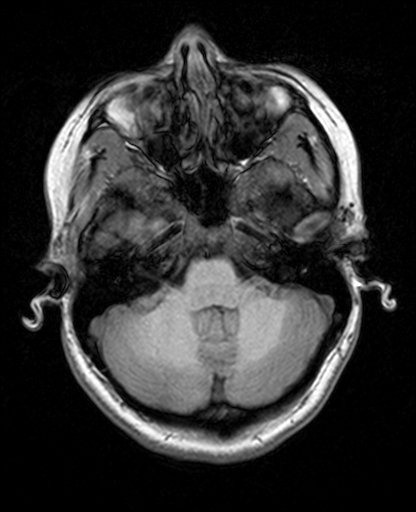
[im 35/93]
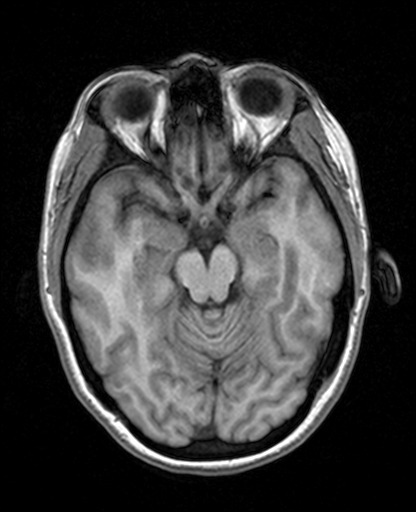
[im 58/93]
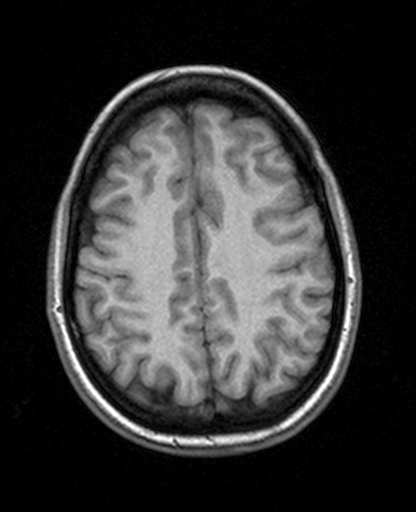
[im 70/93]
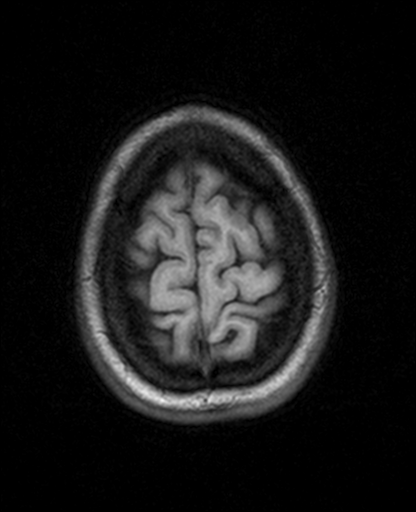
[im 81/93]
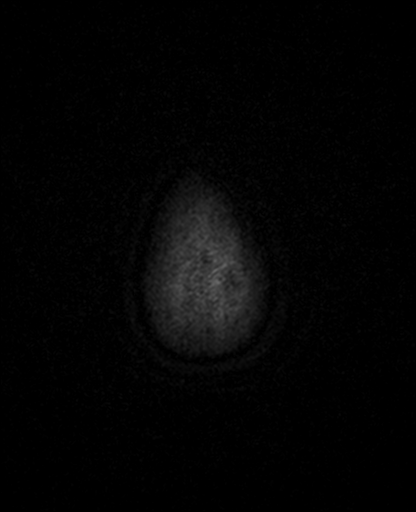
[im 93/93]
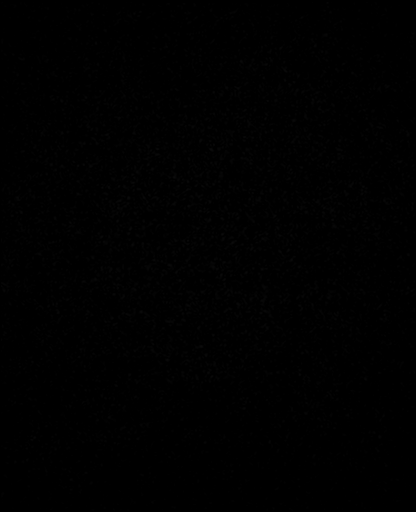

[Series 8: T2 · coronal · 5.0mm · 0.60mm/px · 3 of 28 slices shown (2 of 2)]
[im 1/28]
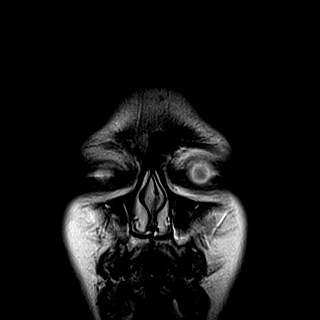
[im 14/28]
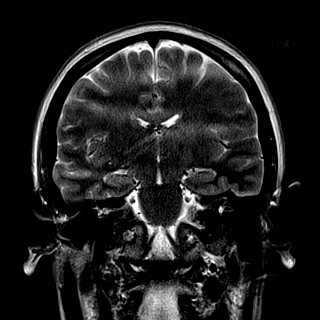
[im 28/28]
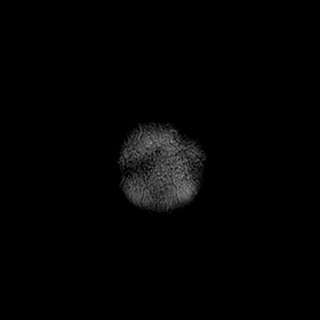

[Series 9: T1 post-contrast · coronal · 5.0mm · 0.36mm/px · 2 of 24 slices shown (1 of 2)]
[im 1/24]
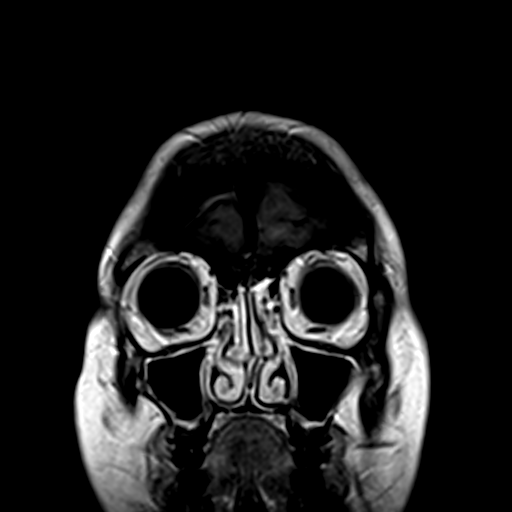
[im 24/24]
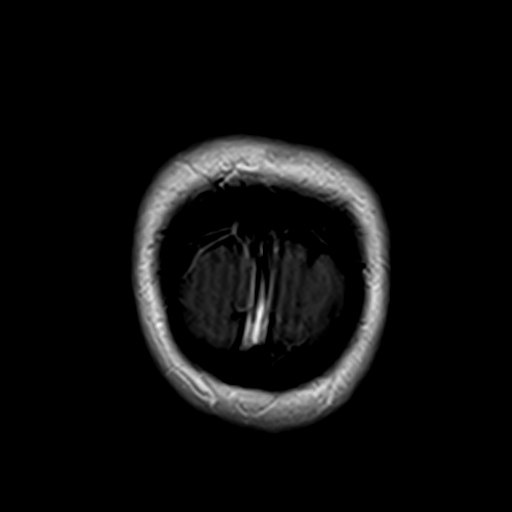

[Series 10: T1 post-contrast · axial · 2.0mm · 0.44mm/px · z∈[-137,+26]mm · 8 of 83 slices shown (2 of 2)]
[im 1/83]
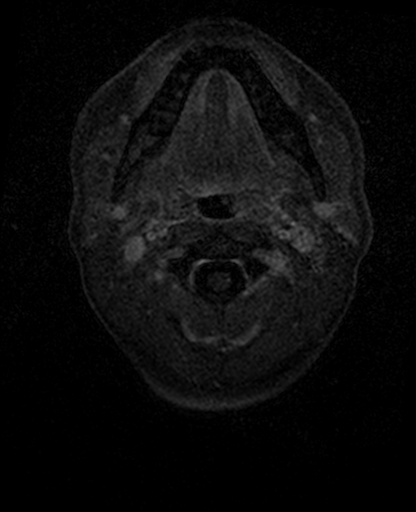
[im 12/83]
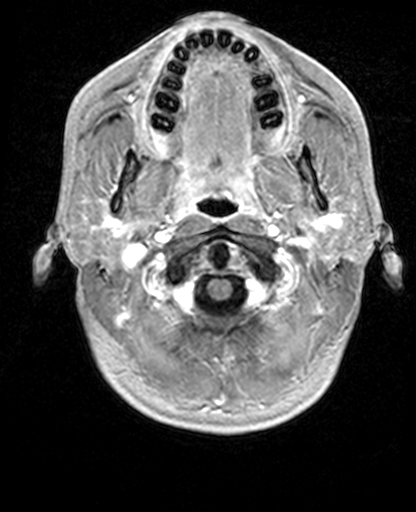
[im 24/83]
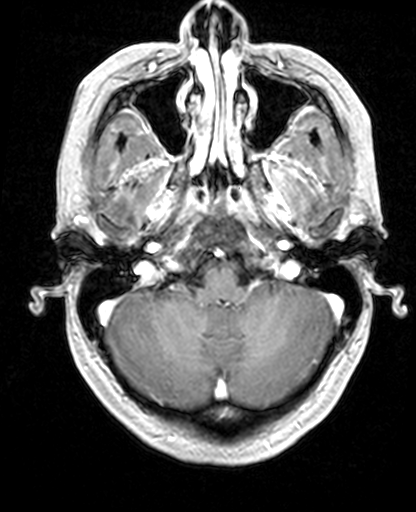
[im 36/83]
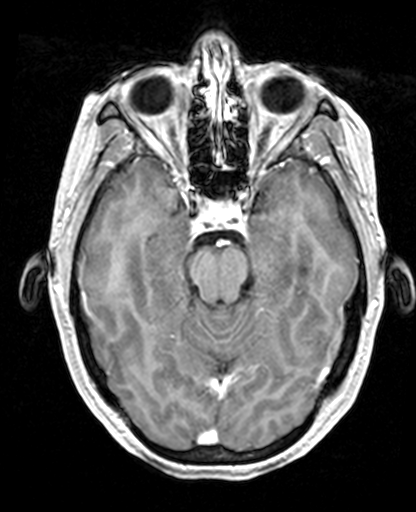
[im 47/83]
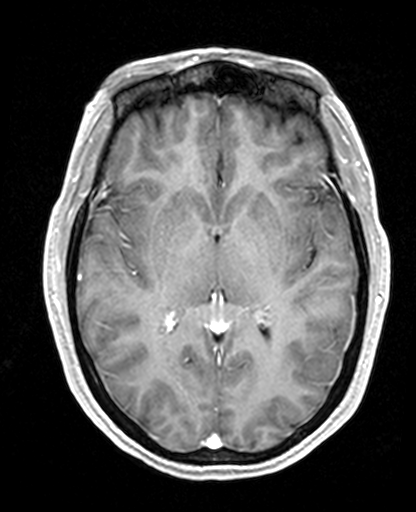
[im 59/83]
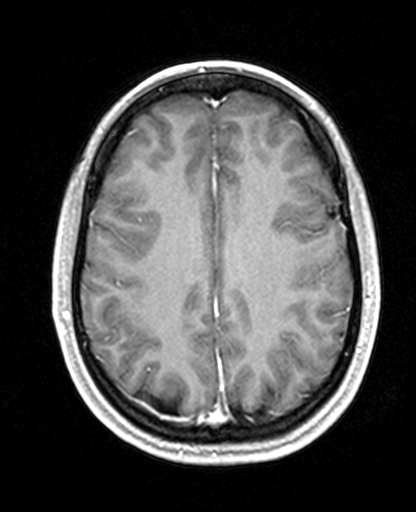
[im 71/83]
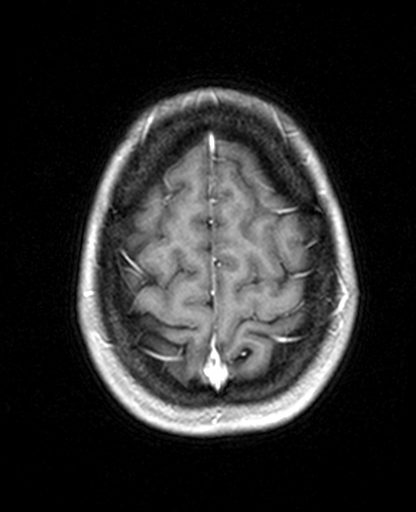
[im 83/83]
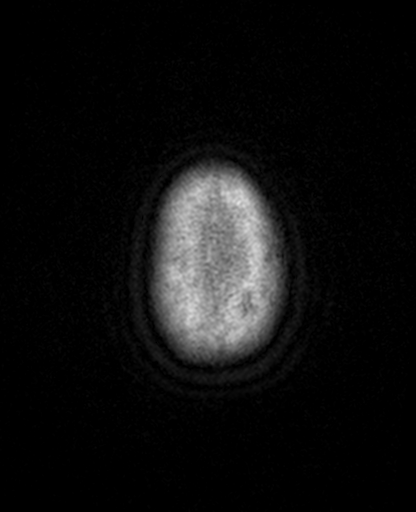

[25 of 48 positions shown; findings below may reference images not displayed]

FINDINGS: MR HEAD FINDINGS

No evidence for acute infarction, hemorrhage, mass lesion,
hydrocephalus, or extra-axial fluid. There is no atrophy or white
matter disease. Flow voids are maintained throughout the carotid,
basilar, and vertebral arteries. There are no areas of chronic
hemorrhage. Pituitary, pineal, and cerebellar tonsils unremarkable.
No upper cervical lesions. Post infusion, no abnormal enhancement of
the brain or meninges. Visualized calvarium, skull base, and upper
cervical osseous structures unremarkable. Scalp and extracranial
soft tissues, orbits, sinuses, and mastoids show no acute process.

MR CIRCLE OF WILLIS FINDINGS

The internal carotid arteries are widely patent. The basilar artery
is widely patent with vertebrals codominant. There is no
intracranial stenosis or aneurysm. Both ophthalmic arteries appear
patent.

MRV INTRACRANIAL FINDINGS

The superior sagittal sinus is widely patent. Both transverse
sinuses and sigmoid sinuses are widely patent. Both internal jugular
veins are unremarkable.

Internal cerebral veins are widely patent. Vein of Melendes and
straight sinus are widely patent. No cavernous sinus abnormality.

MRI ORBITS FINDINGS

The globes are equal in size and appear normal. Gaze is mildly
dysconjugate. Normal-appearing optic nerves. No perioptic nerve
sheath dilatation. No evidence for papilledema. No medial or
inferior blowout injury. No evidence for pseudotumor or lacrimal
gland enlargement. Retro-orbital/intraconal fat is normal. Mild
chronic sinus disease without orbital inflammation. Normal sized
orbital musculature. No intraorbital masses or evidence for vascular
anomaly.
IMPRESSION: Negative MRI of the brain. No acute or focal intracranial
abnormality.

Negative MRI of the orbits. No cause seen for RIGHT eye visual loss.

Normal MRA intracranial. No evidence for aneurysm or ophthalmic
artery occlusion.

Normal MRV intracranial.  No venous sinus thrombosis is evident.
